# Patient Record
Sex: Male | Born: 1970 | Race: White | Hispanic: No | Marital: Married | Smoking: Never smoker
Health system: Southern US, Community
[De-identification: ages and names within clinical notes are randomized; demographics above are authoritative.]

## PROBLEM LIST (undated history)

## (undated) DIAGNOSIS — E119 Type 2 diabetes mellitus without complications: Secondary | ICD-10-CM

## (undated) HISTORY — PX: ANTERIOR CRUCIATE LIGAMENT REPAIR: SHX115

---

## 2007-08-16 ENCOUNTER — Encounter: Admission: RE | Admit: 2007-08-16 | Discharge: 2007-08-16 | Payer: Self-pay | Admitting: Family Medicine

## 2008-09-08 IMAGING — CT CT ABDOMEN W/O CM
2 of 3 series · 17 of 46 positions shown, 19 images · non-contrast
Comparison: None.

CT ABDOMEN

CLINICAL DATA: Hematuria.  Right flank pain 3 weeks.  Nausea.
History prior renal calculi.

CT ABDOMEN AND PELVIS WITHOUT CONTRAST
TECHNIQUE: Multidetector CT imaging of the abdomen and pelvis was
performed following the standard
protocol without intravenous contrast.

[Series 2: renal stone w/o · axial · non-contrast · 0.68mm/px · z∈[-326,-0]mm · 14 of 74 slices shown, 16 images]
[im 5/74  soft-tissue]
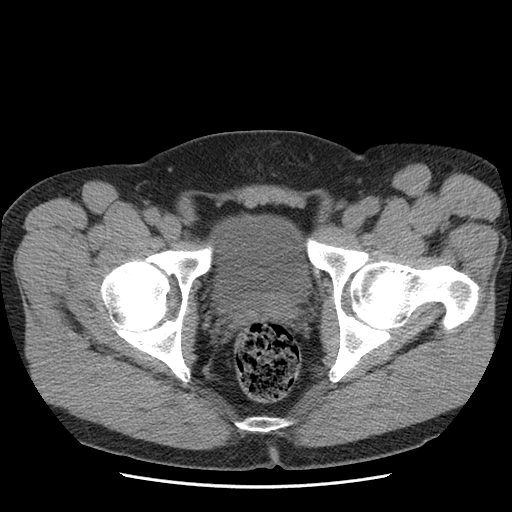
[im 5/74  bone]
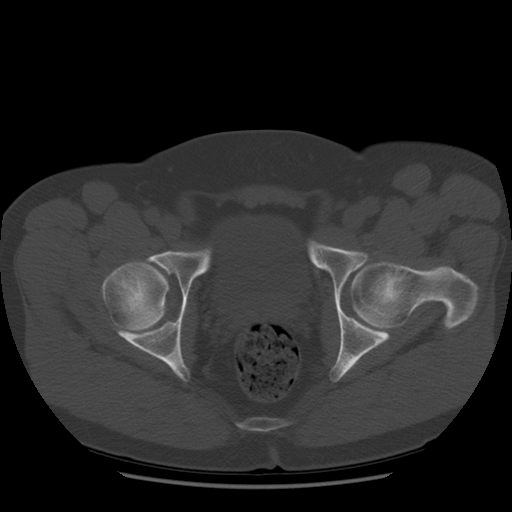
[im 10/74  soft-tissue]
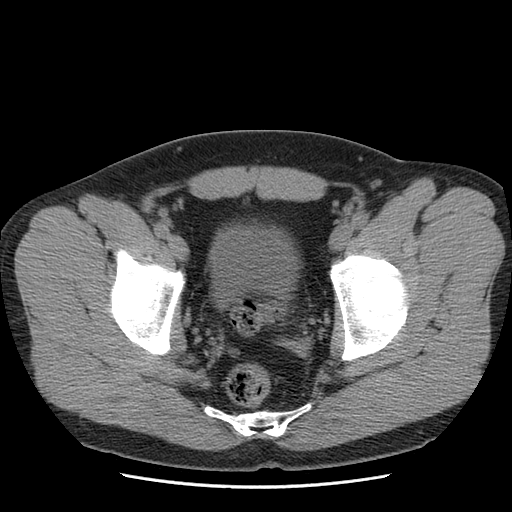
[im 15/74  soft-tissue]
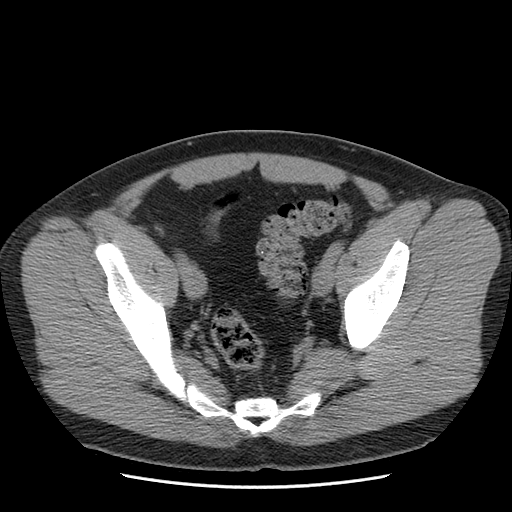
[im 19/74  soft-tissue]
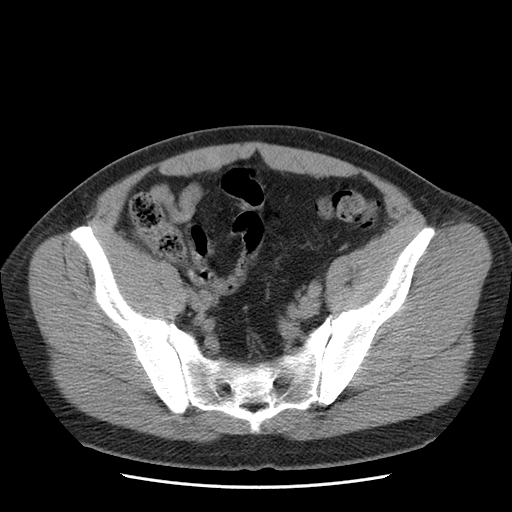
[im 24/74  soft-tissue]
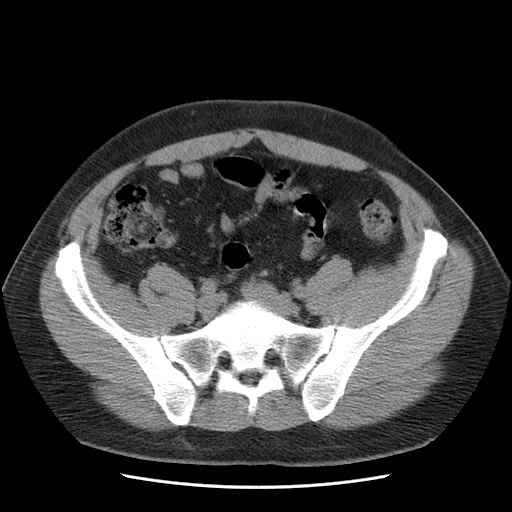
[im 29/74  soft-tissue]
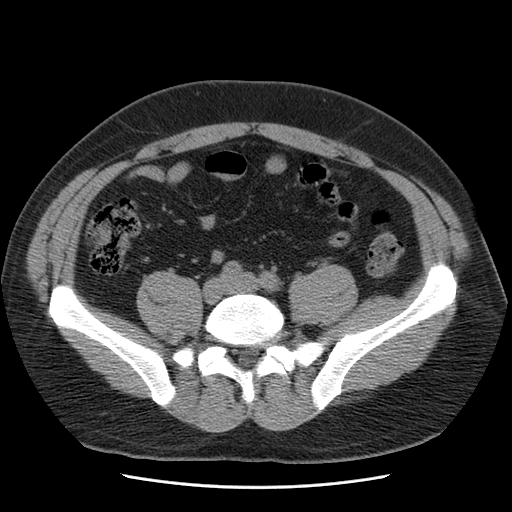
[im 33/74  soft-tissue]
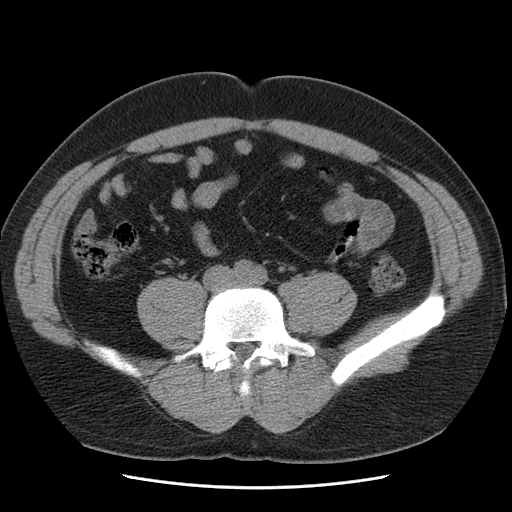
[im 41/74  soft-tissue]
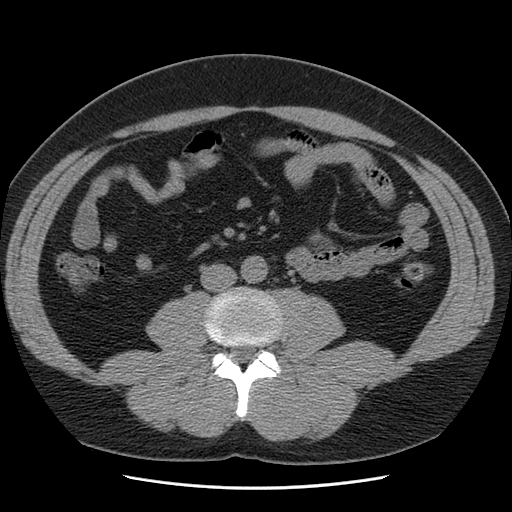
[im 45/74  soft-tissue]
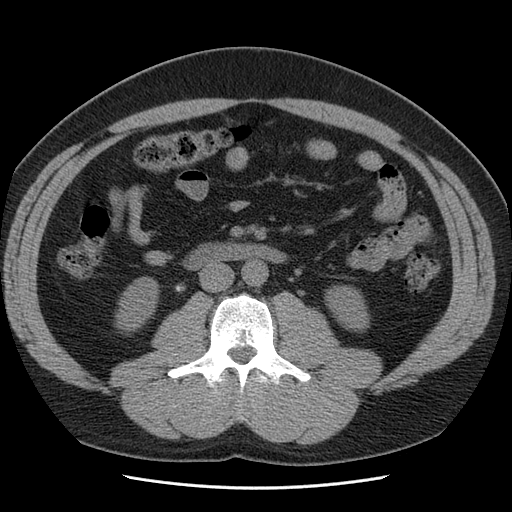
[im 45/74  bone]
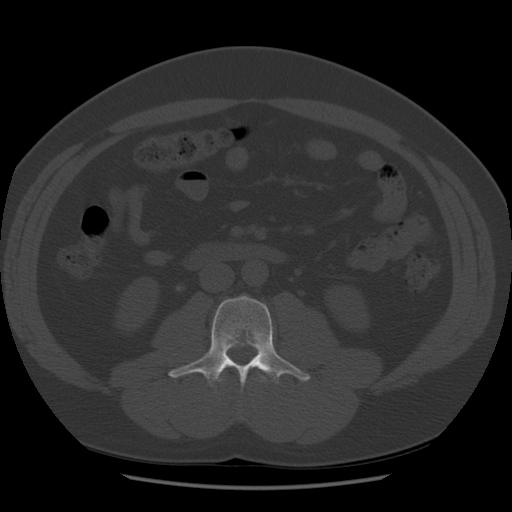
[im 50/74  soft-tissue]
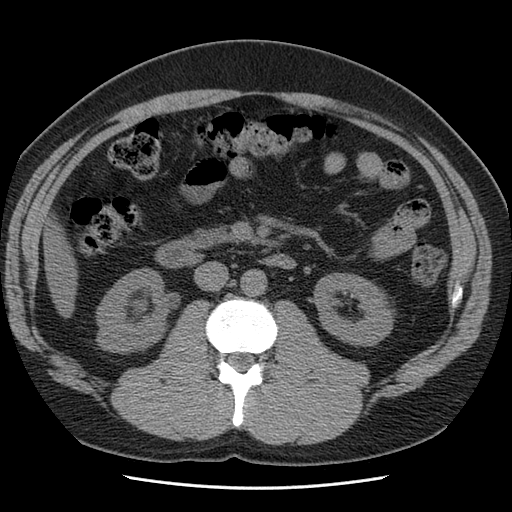
[im 55/74  soft-tissue]
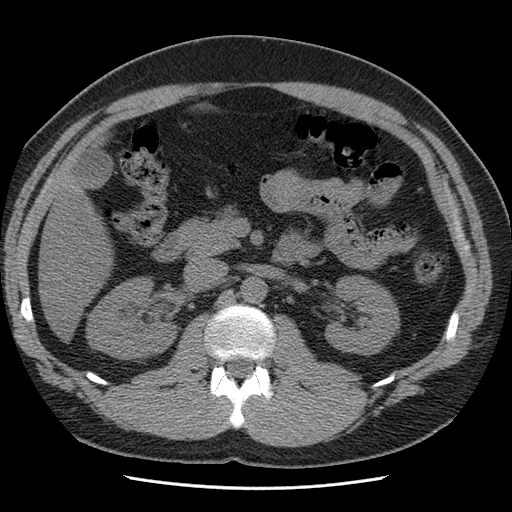
[im 59/74  soft-tissue]
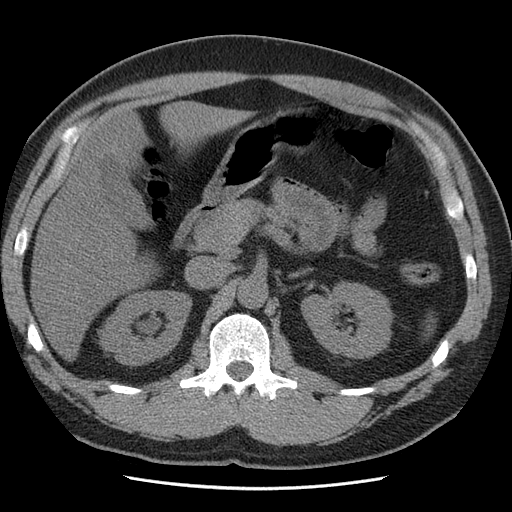
[im 64/74  soft-tissue]
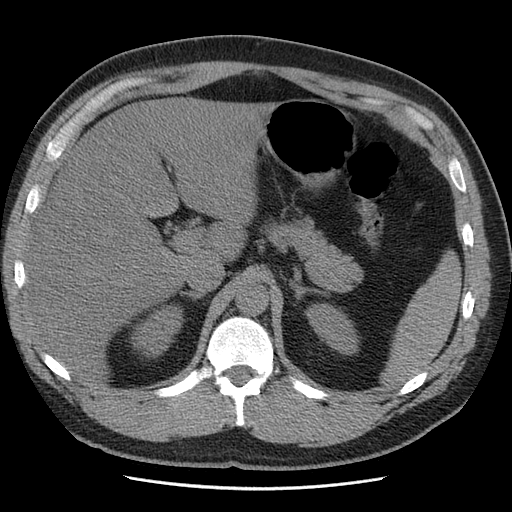
[im 69/74  soft-tissue]
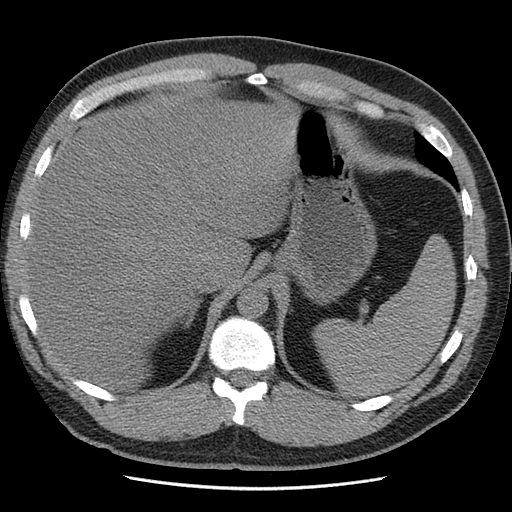

[Series 400: cor abd · coronal · 0.85mm/px · 3 of 136 slices shown]
[im 46/136  soft-tissue]
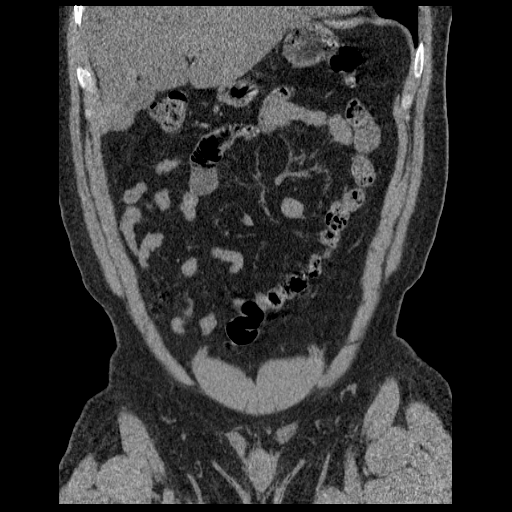
[im 61/136  soft-tissue]
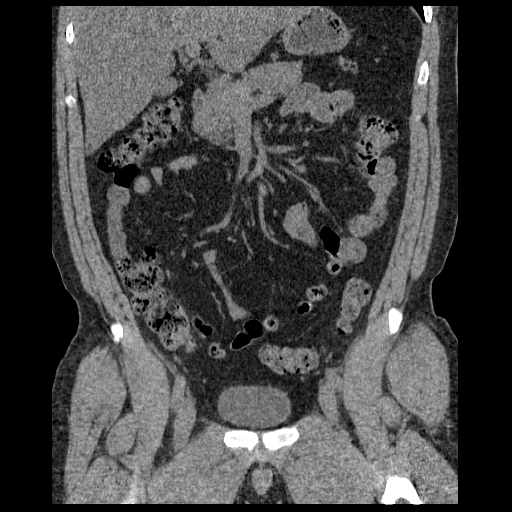
[im 76/136  soft-tissue]
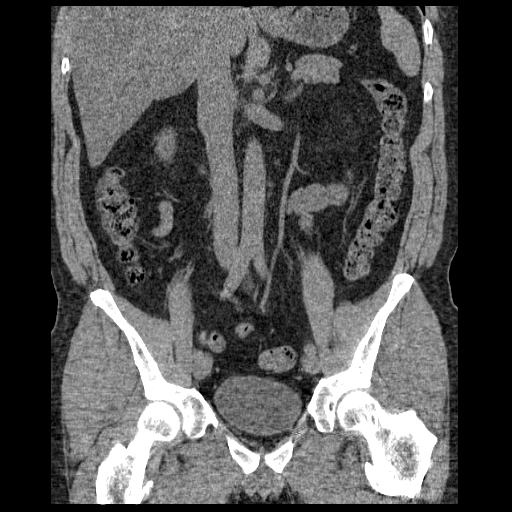

[17 of 46 positions shown; findings below may reference images not displayed]

FINDINGS: Obstructing 6 mm right ureteral pelvic junction calculus
is seen with moderate proximal hydronephrosis.  Three 1 mm
nonobstructing upper, mid, lower pole right renal calculi and
solitary 1 mm nonobstructing lower pole left (sagittal image 8873)
noted.  No left renal focal lesion or hydronephrosis seen.  Slight
diffuse fatty infiltration liver finding noted.  Moderate retained
colonic feces without inflammatory bowel or intestinal obstructive
findings visualized.  Remaining abdominal organs and lung bases
appear normal without inflammation, free fluid or adenopathy.
IMPRESSION: 1.  6 mm obstructing right ureteral pelvic junction calculus with
proximal moderate hydronephrosis.
2.  Slight diffuse fatty infiltration liver.
3.  Three tiny nonobstructing right renal calculi.
4.  Moderate retained colonic feces may represent constipation -
need clinical correlation.
5.  Otherwise, negative.

CT PELVIS
FINDINGS: Distal urinary tract appears normal.  Moderate retained
colonic feces without intestinal obstruction or inflammatory bowel
disease noted.  Discrete appendix is not identified yet no right
lower quadrant inflammation is seen.  No adenopathy or free fluid
noted.
IMPRESSION: 1.  Moderate retained colonic feces may represent constipation.
2.  Otherwise, negative.

## 2016-07-24 ENCOUNTER — Encounter (HOSPITAL_COMMUNITY): Payer: Self-pay

## 2016-07-24 ENCOUNTER — Emergency Department (HOSPITAL_COMMUNITY)
Admission: EM | Admit: 2016-07-24 | Discharge: 2016-07-24 | Disposition: A | Discharge status: Home / Self Care | Payer: Worker's Compensation | Attending: Emergency Medicine | Admitting: Emergency Medicine

## 2016-07-24 ENCOUNTER — Emergency Department (HOSPITAL_COMMUNITY): Payer: Worker's Compensation

## 2016-07-24 DIAGNOSIS — S99911A Unspecified injury of right ankle, initial encounter: Secondary | ICD-10-CM | POA: Diagnosis present

## 2016-07-24 DIAGNOSIS — S93401A Sprain of unspecified ligament of right ankle, initial encounter: Secondary | ICD-10-CM | POA: Insufficient documentation

## 2016-07-24 DIAGNOSIS — E119 Type 2 diabetes mellitus without complications: Secondary | ICD-10-CM | POA: Insufficient documentation

## 2016-07-24 DIAGNOSIS — W010XXA Fall on same level from slipping, tripping and stumbling without subsequent striking against object, initial encounter: Secondary | ICD-10-CM | POA: Diagnosis not present

## 2016-07-24 DIAGNOSIS — Y9301 Activity, walking, marching and hiking: Secondary | ICD-10-CM | POA: Diagnosis not present

## 2016-07-24 DIAGNOSIS — Y9289 Other specified places as the place of occurrence of the external cause: Secondary | ICD-10-CM | POA: Diagnosis not present

## 2016-07-24 DIAGNOSIS — Y990 Civilian activity done for income or pay: Secondary | ICD-10-CM | POA: Diagnosis not present

## 2016-07-24 HISTORY — DX: Type 2 diabetes mellitus without complications: E11.9

## 2016-07-24 MED ORDER — HYDROCODONE-ACETAMINOPHEN 5-325 MG PO TABS
1.0000 | ORAL_TABLET | Freq: Once | ORAL | Status: DC
  Filled 2016-07-24: qty 1

## 2016-07-24 MED ORDER — DICLOFENAC SODIUM 50 MG PO TBEC: 50.0000 mg | DELAYED_RELEASE_TABLET | Freq: Two times a day (BID) | ORAL | 0 refills | Status: AC

## 2016-07-24 NOTE — Progress Notes (Signed)
Orthopedic Tech Progress Note Patient Details:  Larry Garcia [birth year] [id number]  Ortho Devices Type of Ortho Device: ASO, Crutches Ortho Device/Splint Location: right ankle foot. Ortho Device/Splint Interventions: Application, Adjustment   Larry Garcia, Larry Garcia 07/24/2016, 10:39 PM

## 2016-07-24 NOTE — ED Triage Notes (Signed)
Pt states that he was working and tripped over a box injuring his R ankle. Swelling and bruising noted.

## 2016-07-24 NOTE — ED Notes (Signed)
Pt refused medication after RN had opened. Pt informed if he changed his mind he could get the pain medication or we could switch it to something less potent.

## 2016-07-24 NOTE — ED Notes (Signed)
Ortho tech paged  

## 2016-07-24 NOTE — ED Provider Notes (Signed)
[facility]-EMERGENCY DEPT Provider Note   CSN: [id number] Arrival date & time: 07/24/16  2023   By signing my name below, I, Soijett Blue, attest that this documentation has been prepared under the direction and in the presence of Kerrie BuffaloHope Neese, NP Electronically Signed: Soijett Blue, ED Scribe. 07/24/16. 10:04 PM.  History   Chief Complaint Chief Complaint  Patient presents with  . Ankle Injury    HPI Brayton Cavesllen M Duman is a 46 y.o. male with a PMHx of DM, who presents to the Emergency Department complaining of right ankle injury occurring PTA. Pt reports associated right ankle swelling, tingling to right ankle, and bruising to right ankle. Pt has tried 600 mg ibuprofen with mild relief of his symptoms.  Pt reports that he was ambulating out of work when he tripped over a box which caused him to roll his right ankle. Pt right ankle pain is worsened with ambulation and alleviated with rest. He denies wound and any other symptoms.    The history is provided by the patient. No language interpreter was used.  Ankle Injury  This is a new problem. The current episode started 3 to 5 hours ago. The problem occurs rarely. The problem has not changed since onset.The symptoms are aggravated by walking. Relieved by: ibuprofen. Treatments tried: ibuprofen. The treatment provided no relief.    Past Medical History:  Diagnosis Date  . Diabetes mellitus without complication ([facility])     There are no active problems to display for this patient.   Past Surgical History:  Procedure Laterality Date  . ANTERIOR CRUCIATE LIGAMENT REPAIR         Home Medications    Prior to Admission medications   Medication Sig Start Date End Date Taking? Authorizing Provider  diclofenac (VOLTAREN) 50 MG EC tablet Take 1 tablet (50 mg total) by mouth 2 (two) times daily. 07/24/16   Janne NapoleonNeese, Hope M, NP    Family History No family history on file.  Social History Social History  Substance Use Topics  . Smoking status:  Never Smoker  . Smokeless tobacco: Never Used  . Alcohol use No     Allergies   Patient has no known allergies.   Review of Systems Review of Systems  Gastrointestinal: Negative for nausea.  Musculoskeletal: Positive for arthralgias (right ankle) and joint swelling (right ankle).  Skin: Positive for color change (bruising to right ankle). Negative for wound.  Neurological: Negative for weakness.  Psychiatric/Behavioral: The patient is not nervous/anxious.      Physical Exam Updated Vital Signs BP (!) 155/93 (BP Location: Left Arm)   Pulse 100   Temp 97.7 F (36.5 C) (Oral)   Resp 18   SpO2 96%   Physical Exam  Constitutional: He appears well-developed and well-nourished. No distress.  HENT:  Head: Normocephalic and atraumatic.  Eyes: EOM are normal.  Neck: Neck supple.  Cardiovascular: Normal rate.   Pulmonary/Chest: Effort normal. No respiratory distress.  Abdominal: He exhibits no distension.  Musculoskeletal: He exhibits tenderness.       Right ankle: He exhibits swelling and ecchymosis. He exhibits no deformity, no laceration and normal pulse. Decreased range of motion: due to pain. Tenderness. Lateral malleolus tenderness found. Achilles tendon normal.  Neurological: He is alert.  Skin: Skin is warm and dry.  Psychiatric: He has a normal mood and affect. His behavior is normal.  Nursing note and vitals reviewed.    ED Treatments / Results  DIAGNOSTIC STUDIES: Oxygen Saturation is 96% on RA, nl  by my interpretation.    COORDINATION OF CARE: 10:01 PM Discussed treatment plan with pt at bedside and pt agreed to plan.   Labs (all labs ordered are listed, but only abnormal results are displayed) Labs Reviewed - No data to display  Radiology Dg Ankle Complete Right  Result Date: 07/24/2016 CLINICAL DATA:  Tripped over a box injuring RIGHT ankle while working today at 1800 hours, swelling and bruising laterally, initial encounter EXAM: RIGHT ANKLE -  COMPLETE 3+ VIEW COMPARISON:  None FINDINGS: Soft tissue swelling greatest laterally. Osseous mineralization normal. Ankle joint space preserved. No acute fracture, dislocation, or bone destruction. IMPRESSION: No acute osseous abnormalities. Electronically Signed   By: Ulyses Southward M.D.   On: 07/24/2016 21:43    Procedures Procedures (including critical care time)  Medications Ordered in ED Medications  HYDROcodone-acetaminophen (NORCO/VICODIN) 5-325 MG per tablet 1 tablet (not administered)     Initial Impression / Assessment and Plan / ED Course  I have reviewed the triage vital signs and the nursing notes. Patient X-Ray negative for obvious fracture or dislocation.  Pt advised to follow up with orthopedics. Patient given ASO splint and crutches while in ED. Pt will be discharged home with voltaren prescription. Conservative therapy recommended and discussed. Patient will be discharged home & is agreeable with above plan. Returns precautions discussed. Pt appears safe for discharge.  Final Clinical Impressions(s) / ED Diagnoses   Final diagnoses:  Sprain of right ankle, unspecified ligament, initial encounter    New Prescriptions New Prescriptions   DICLOFENAC (VOLTAREN) 50 MG EC TABLET    Take 1 tablet (50 mg total) by mouth 2 (two) times daily.  I personally performed the services described in this documentation, which was scribed in my presence. The recorded information has been reviewed and is accurate.    Kerrie Buffalo [place], [place] 07/24/16 2207    Geoffery Lyons, MD 07/24/16 2350

## 2016-07-24 NOTE — ED Notes (Signed)
Pt and family understood dc material. NAD noted. Scripts given at dc 

## 2017-08-17 IMAGING — DX DG ANKLE COMPLETE 3+V*R*
3 series · 3 of 3 positions shown · non-contrast
Comparison: None

CLINICAL DATA: Tripped over a box injuring RIGHT ankle while
working today at 3622 hours, swelling and bruising laterally,
initial encounter

EXAM:
RIGHT ANKLE - COMPLETE 3+ VIEW

[ankle ap]
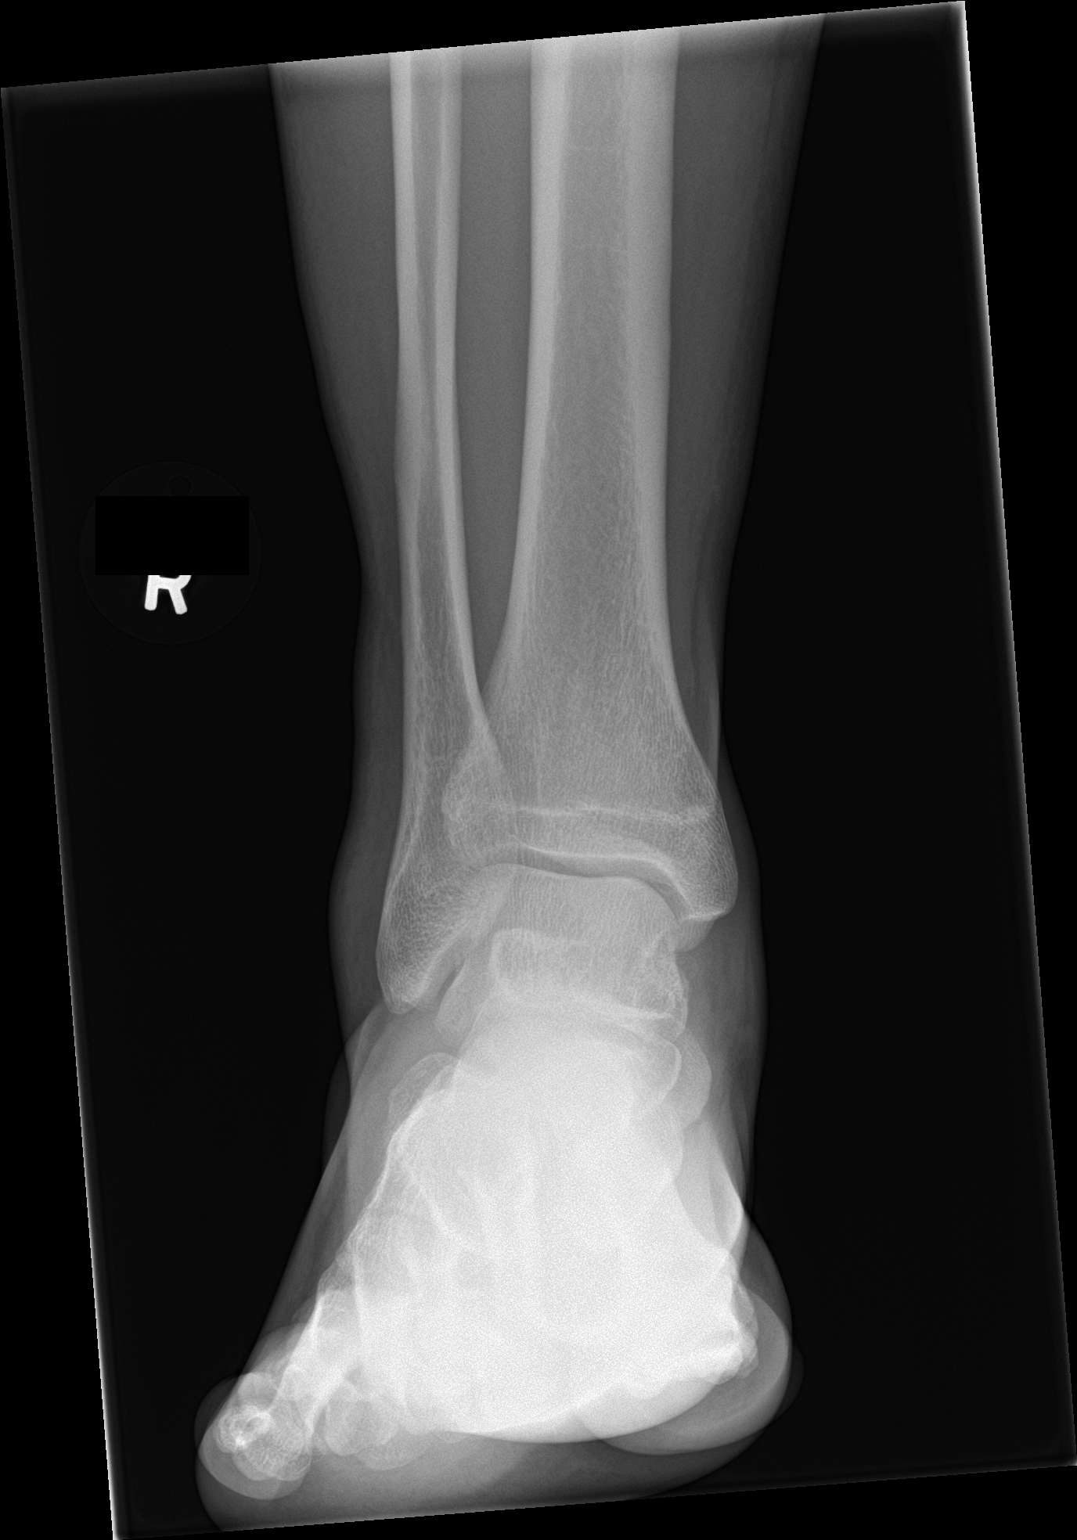

[ankle obl]
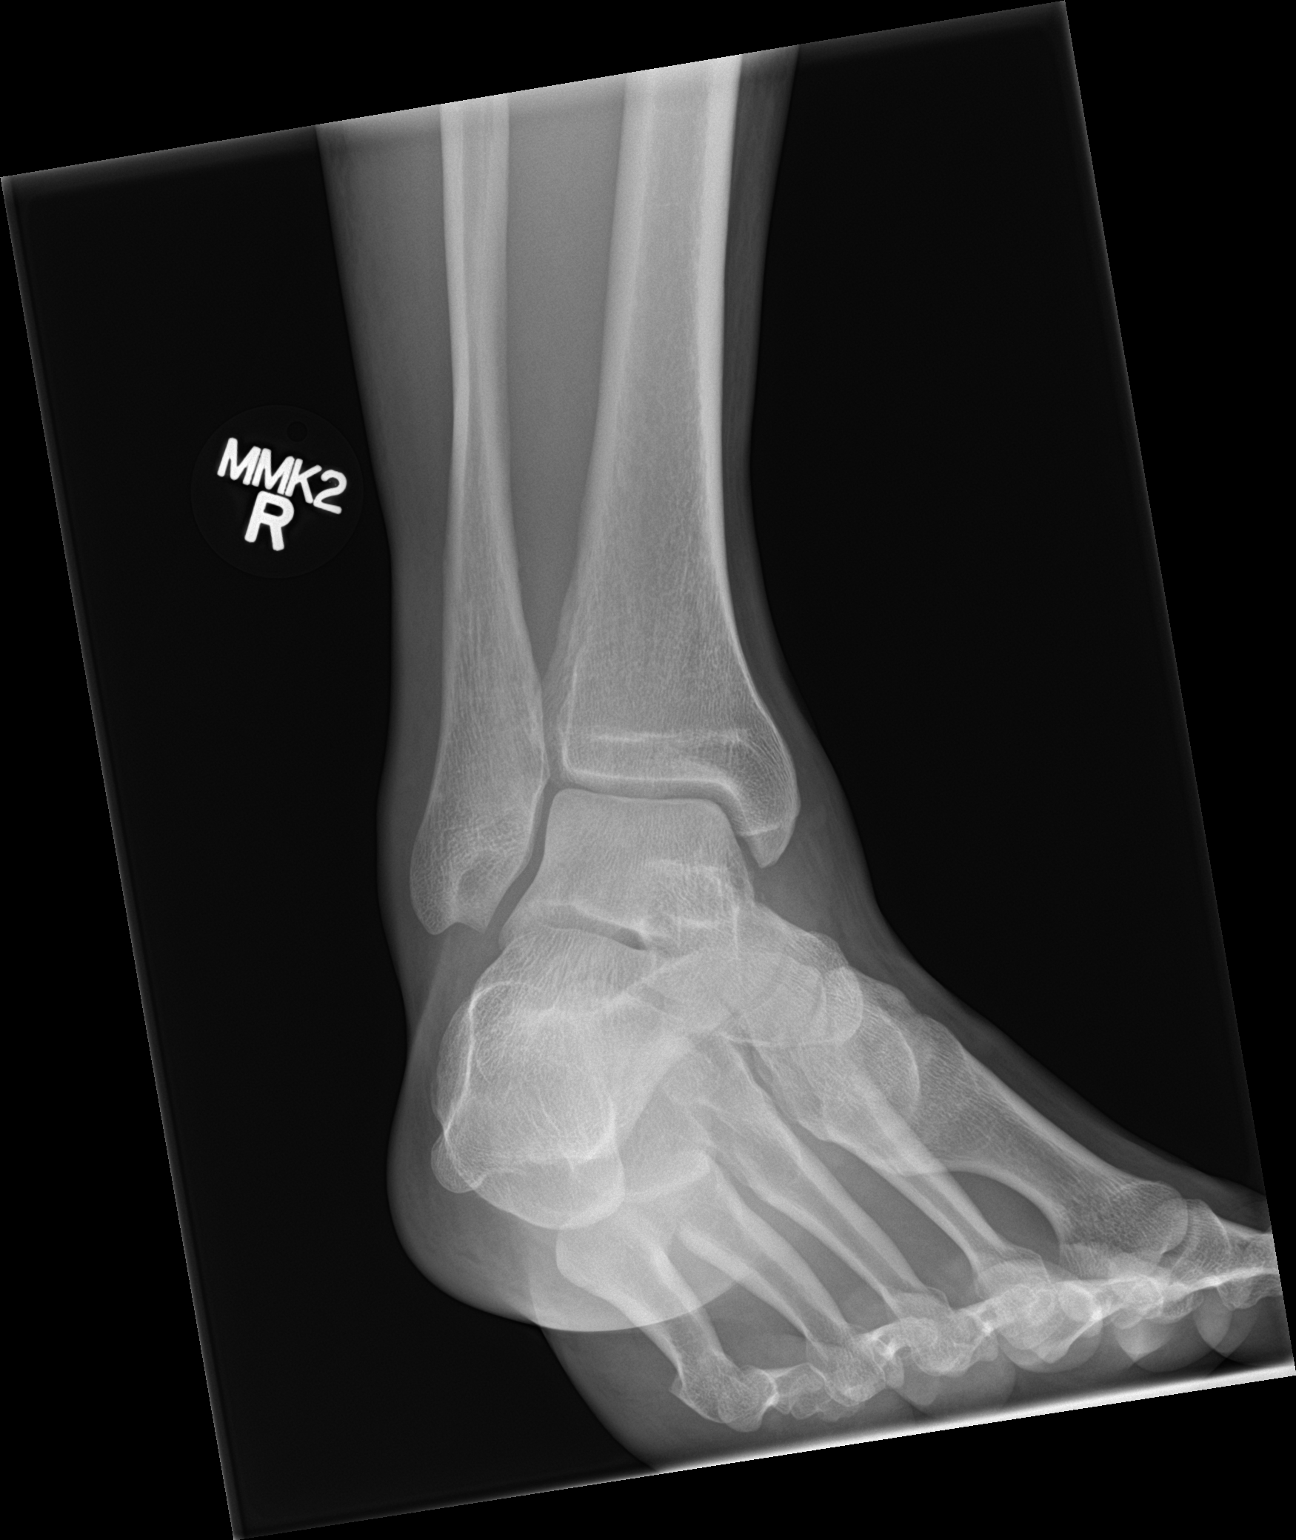

[ankle lat]
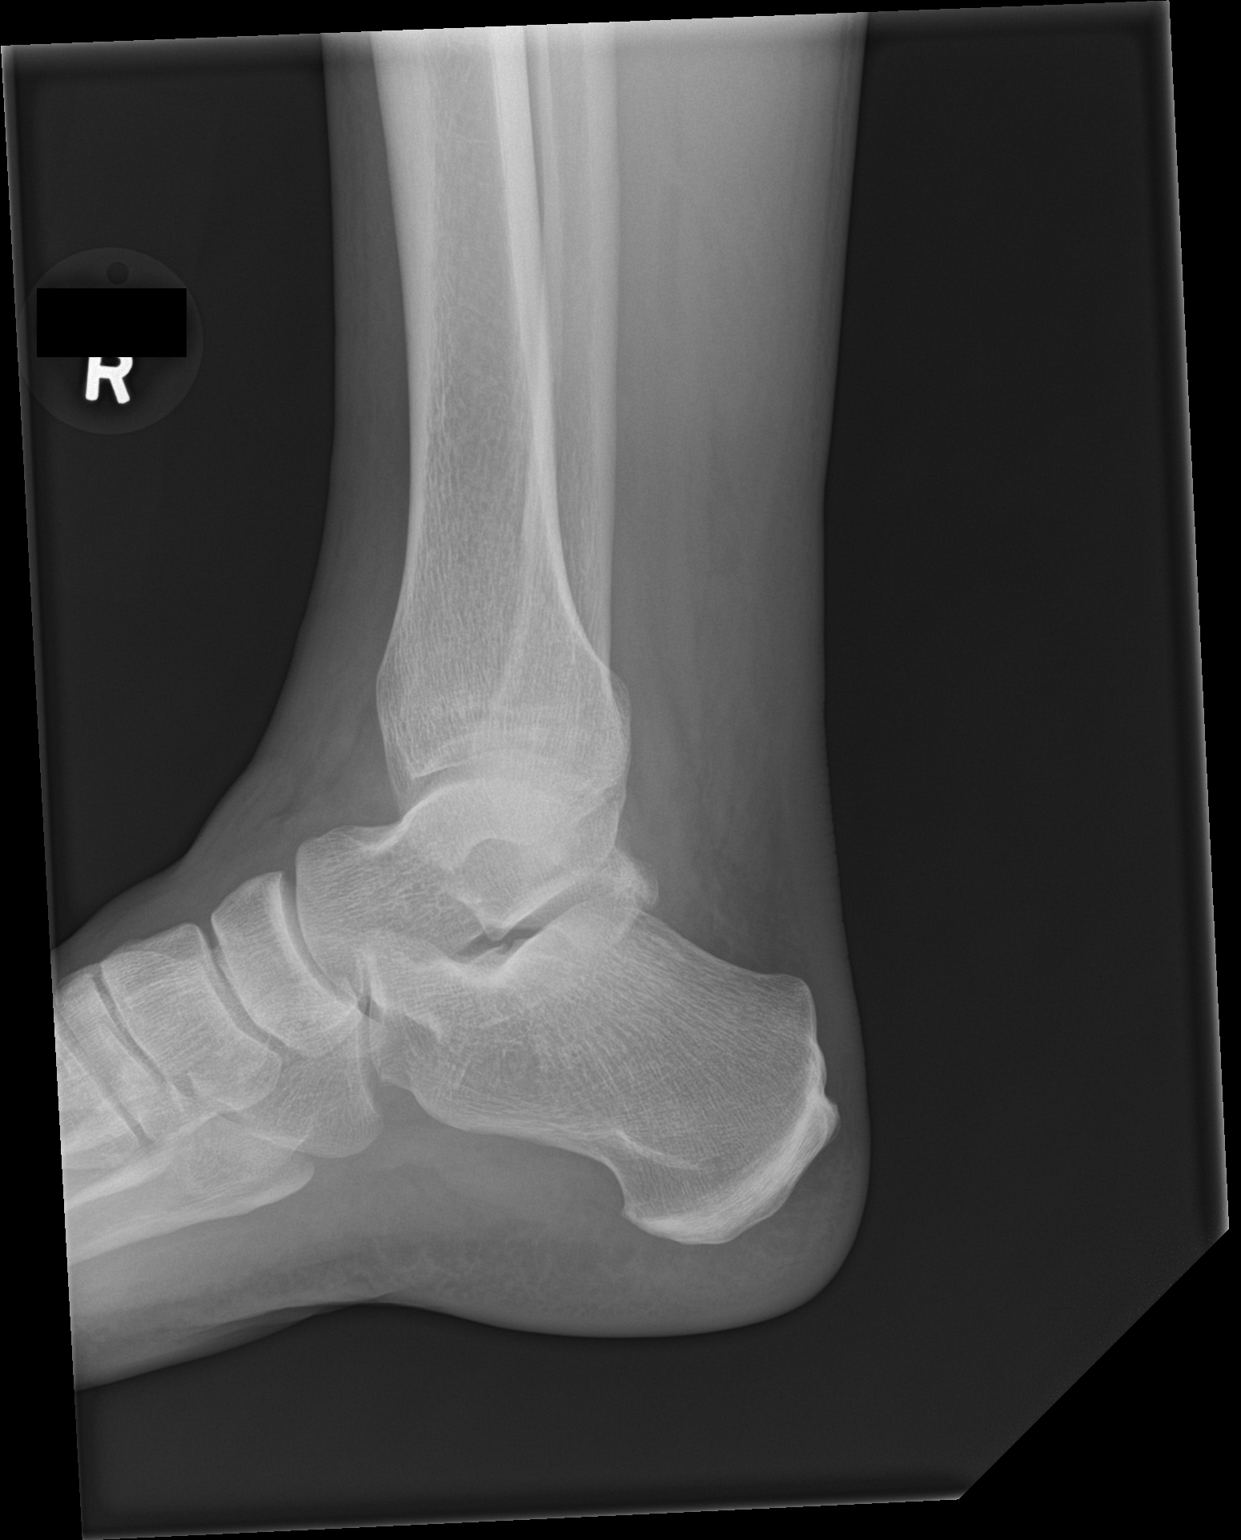

[3 of 3 positions shown; findings below may reference images not displayed]

FINDINGS: Soft tissue swelling greatest laterally.

Osseous mineralization normal.

Ankle joint space preserved.

No acute fracture, dislocation, or bone destruction.
IMPRESSION: No acute osseous abnormalities.

## 2018-10-07 ENCOUNTER — Emergency Department (HOSPITAL_BASED_OUTPATIENT_CLINIC_OR_DEPARTMENT_OTHER)
Admission: EM | Admit: 2018-10-07 | Discharge: 2018-10-07 | Disposition: A | Discharge status: Home / Self Care | Payer: BC Managed Care – PPO | Attending: Emergency Medicine | Admitting: Emergency Medicine

## 2018-10-07 ENCOUNTER — Emergency Department (HOSPITAL_BASED_OUTPATIENT_CLINIC_OR_DEPARTMENT_OTHER): Payer: BC Managed Care – PPO

## 2018-10-07 ENCOUNTER — Encounter (HOSPITAL_BASED_OUTPATIENT_CLINIC_OR_DEPARTMENT_OTHER): Payer: Self-pay

## 2018-10-07 ENCOUNTER — Other Ambulatory Visit: Payer: Self-pay

## 2018-10-07 DIAGNOSIS — Y999 Unspecified external cause status: Secondary | ICD-10-CM | POA: Diagnosis not present

## 2018-10-07 DIAGNOSIS — E119 Type 2 diabetes mellitus without complications: Secondary | ICD-10-CM | POA: Insufficient documentation

## 2018-10-07 DIAGNOSIS — Y9389 Activity, other specified: Secondary | ICD-10-CM | POA: Insufficient documentation

## 2018-10-07 DIAGNOSIS — S4992XA Unspecified injury of left shoulder and upper arm, initial encounter: Secondary | ICD-10-CM | POA: Diagnosis present

## 2018-10-07 DIAGNOSIS — X500XXA Overexertion from strenuous movement or load, initial encounter: Secondary | ICD-10-CM | POA: Insufficient documentation

## 2018-10-07 DIAGNOSIS — S43402A Unspecified sprain of left shoulder joint, initial encounter: Secondary | ICD-10-CM | POA: Diagnosis not present

## 2018-10-07 DIAGNOSIS — Y929 Unspecified place or not applicable: Secondary | ICD-10-CM | POA: Diagnosis not present

## 2018-10-07 NOTE — ED Triage Notes (Signed)
Pt states he heard a pop to left shoulder when lifting last night-NAD-steady gait

## 2018-10-07 NOTE — ED Notes (Signed)
Patient transported to X-ray 

## 2018-10-07 NOTE — Discharge Instructions (Signed)
Continue to range it regularly to avoid getting stiff.  Head and ice are fine and use ibuprofen as needed for pain.

## 2018-10-08 NOTE — ED Provider Notes (Signed)
[facility] EMERGENCY DEPARTMENT Provider Note   CSN: [id number] Arrival date & time: 10/07/18  1737     History   Chief Complaint Chief Complaint  Patient presents with  . Shoulder Injury    HPI Larry Garcia is a 48 y.o. male.     The history is provided by the patient.  Shoulder Injury This is a new problem. The current episode started yesterday. The problem occurs constantly. The problem has been gradually worsening. Associated symptoms comments: Pain in the left shoulder after trying to push up a tailgate of his truck last night and felt a pop and now pain with raising the arm.  No finger tingling or hand weakness. Exacerbated by: lifting the arm. The symptoms are relieved by rest. He has tried rest for the symptoms. The treatment provided no relief.    Past Medical History:  Diagnosis Date  . Diabetes mellitus without complication ([place])     There are no active problems to display for this patient.   Past Surgical History:  Procedure Laterality Date  . ANTERIOR CRUCIATE LIGAMENT REPAIR          Home Medications    Prior to Admission medications   Medication Sig Start Date End Date Taking? Authorizing Provider  diclofenac (VOLTAREN) 50 MG EC tablet Take 1 tablet (50 mg total) by mouth 2 (two) times daily. 07/24/16   Ashley Murrain, NP    Family History No family history on file.  Social History Social History   Tobacco Use  . Smoking status: Never Smoker  . Smokeless tobacco: Never Used  Substance Use Topics  . Alcohol use: No  . Drug use: No     Allergies   Lipitor [atorvastatin calcium]   Review of Systems Review of Systems  All other systems reviewed and are negative.    Physical Exam Updated Vital Signs BP 132/78 (BP Location: Right Arm)   Pulse 70   Temp 98.5 F (36.9 C) (Oral)   Resp 16   Ht 5\' 11"  (1.803 m)   Wt 99.8 kg   SpO2 99%   BMI 30.68 kg/m   Physical Exam Vitals signs and nursing note reviewed.   Constitutional:      Appearance: Normal appearance. He is normal weight.  HENT:     Head: Normocephalic.  Eyes:     Pupils: Pupils are equal, round, and reactive to light.  Cardiovascular:     Rate and Rhythm: Normal rate.     Pulses: Normal pulses.  Pulmonary:     Effort: Pulmonary effort is normal.  Musculoskeletal:     Comments: Inability to actively extend the left shoulder.  Full abduction, abduction, internal and external rotation of the left shoulder without difficulty.  Tenderness in the posterior portion of the shoulder and no notable swelling  Skin:    General: Skin is warm and dry.     Capillary Refill: Capillary refill takes less than 2 seconds.  Neurological:     General: No focal deficit present.     Mental Status: He is alert and oriented to person, place, and time. Mental status is at baseline.  Psychiatric:        Mood and Affect: Mood normal.        Behavior: Behavior normal.        Thought Content: Thought content normal.      ED Treatments / Results  Labs (all labs ordered are listed, but only abnormal results are displayed) Labs Reviewed -  No data to display  EKG None  Radiology Dg Shoulder Left  Result Date: 10/07/2018 CLINICAL DATA:  Left shoulder injury EXAM: LEFT SHOULDER - 2+ VIEW COMPARISON:  None. FINDINGS: There is no evidence of fracture or dislocation. There is no evidence of arthropathy or other focal bone abnormality. Soft tissues are unremarkable. IMPRESSION: Negative. Electronically Signed   By: Charlett Nose M.D.   On: 10/07/2018 18:29    Procedures Procedures (including critical care time)  Medications Ordered in ED Medications - No data to display   Initial Impression / Assessment and Plan / ED Course  I have reviewed the triage vital signs and the nursing notes.  Pertinent labs & imaging results that were available during my care of the patient were reviewed by me and considered in my medical decision making (see chart for  details).        Patient presenting today after an injury to the left shoulder yesterday.  Patient did experience a pop and is having pain with shoulder extension.  Patient states she was usually able to fully range it yesterday but now pain inhibits that.  He can be passively ranged without difficulty and only minor pain.  Suspect rotator cuff injury.  X-rays without acute findings.  Patient will follow-up with Dr. Rennis Chris.  reCommended ongoing range of motion but avoid heavy lifting.  Final Clinical Impressions(s) / ED Diagnoses   Final diagnoses:  Sprain of left shoulder, unspecified shoulder sprain type, initial encounter    ED Discharge Orders    None       Gwyneth Sprout, MD 10/08/18 [phone]

## 2019-10-31 IMAGING — CR DG SHOULDER 2+V*L*
3 series · 3 of 3 positions shown · non-contrast
Comparison: None.

CLINICAL DATA: Left shoulder injury

EXAM:
LEFT SHOULDER - 2+ VIEW

[w shoulder grashey left]
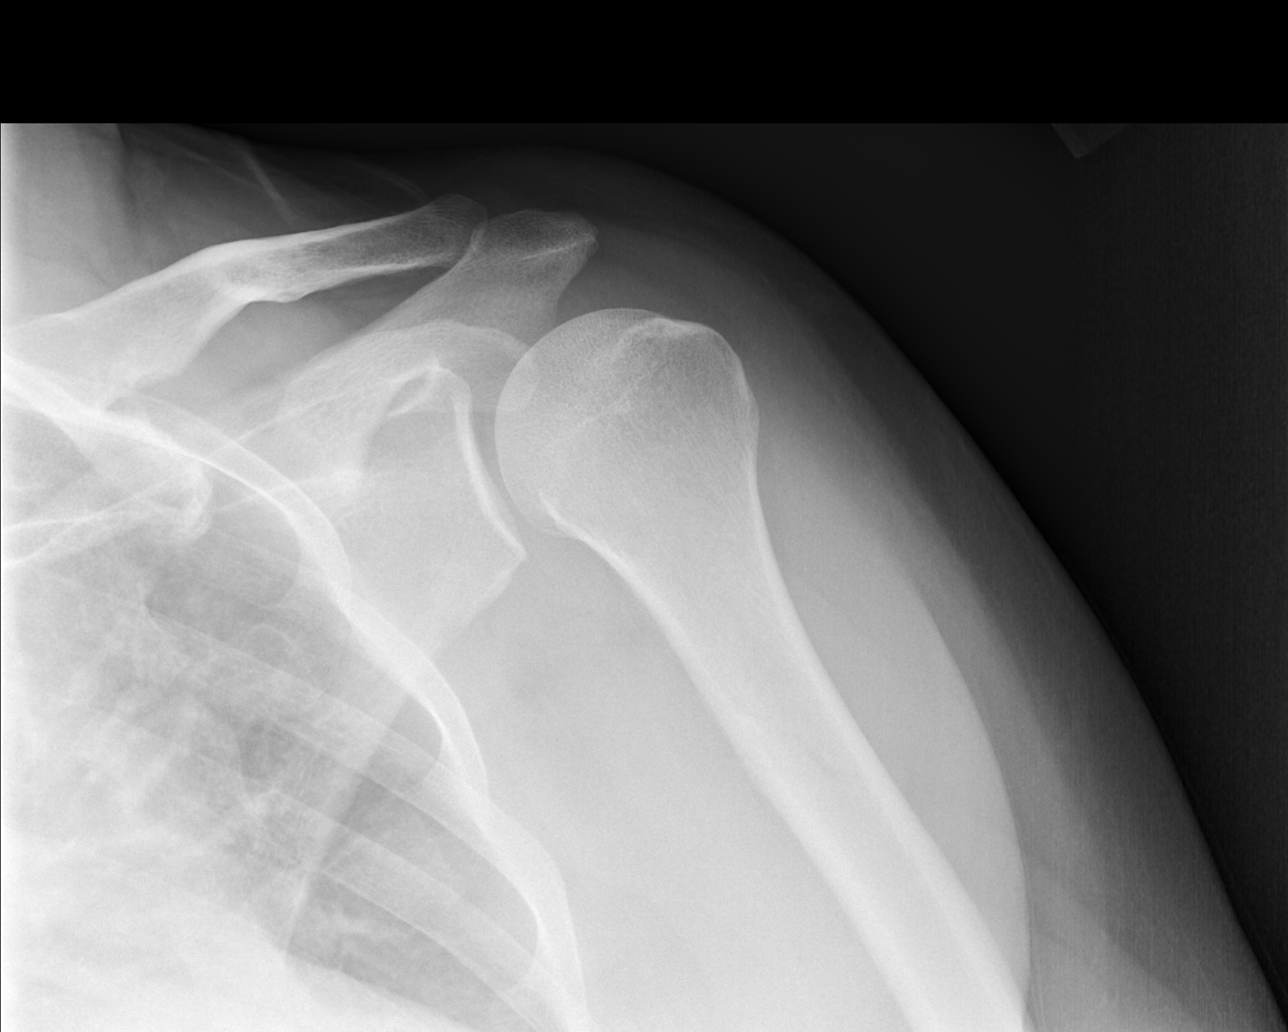

[w shoulder y view left]
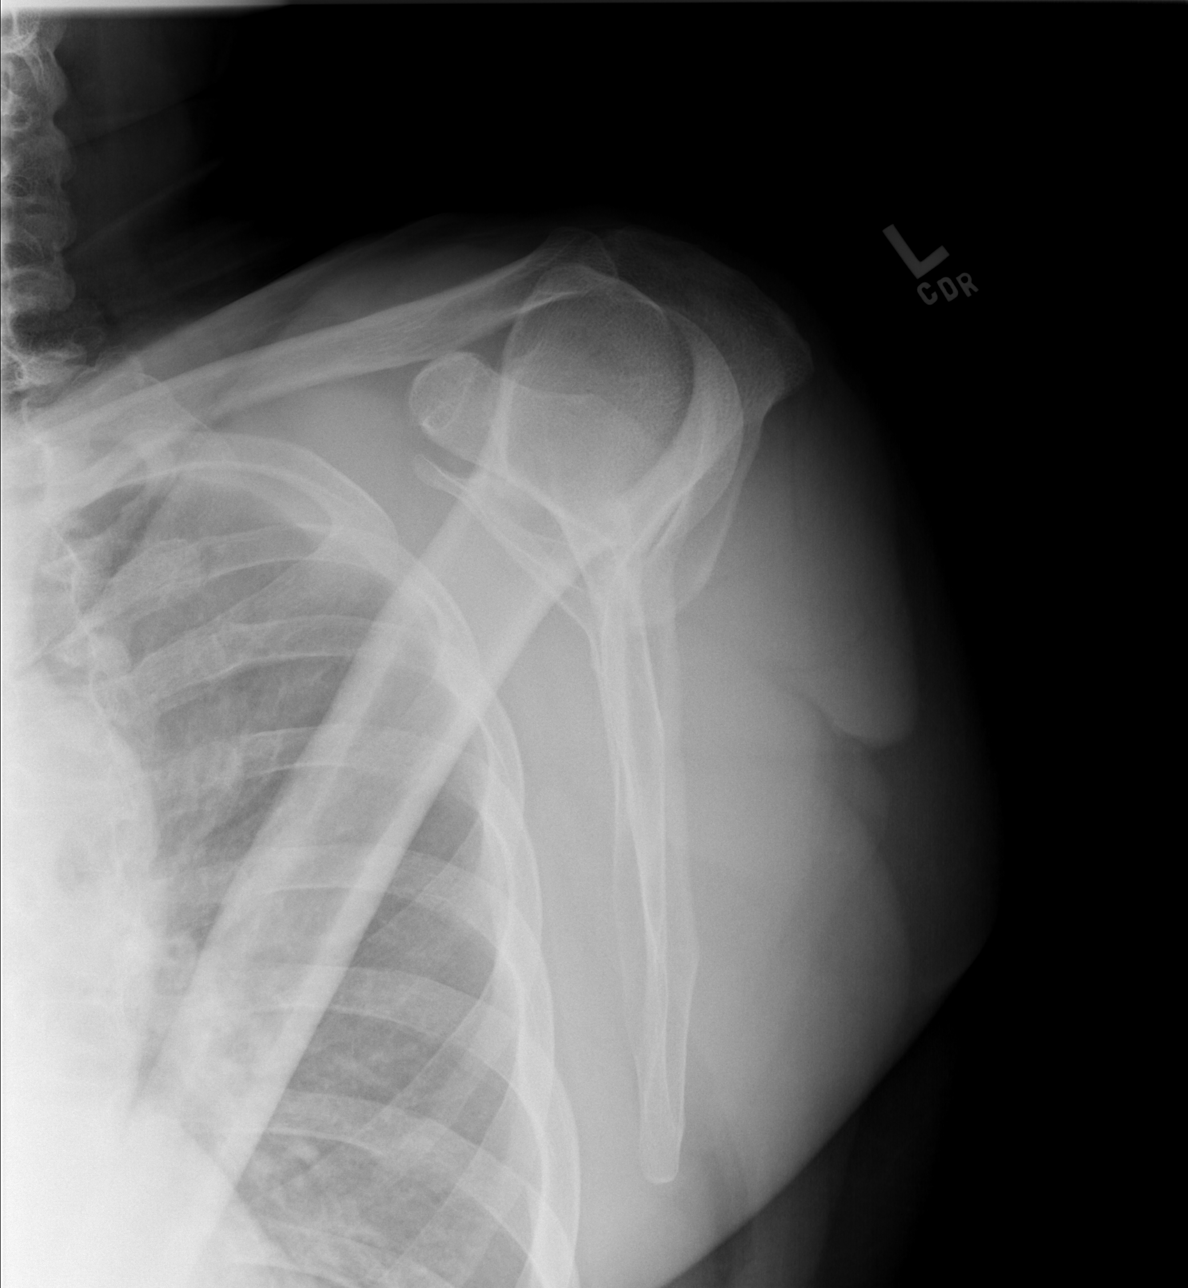

[x shoulder axillary left *]
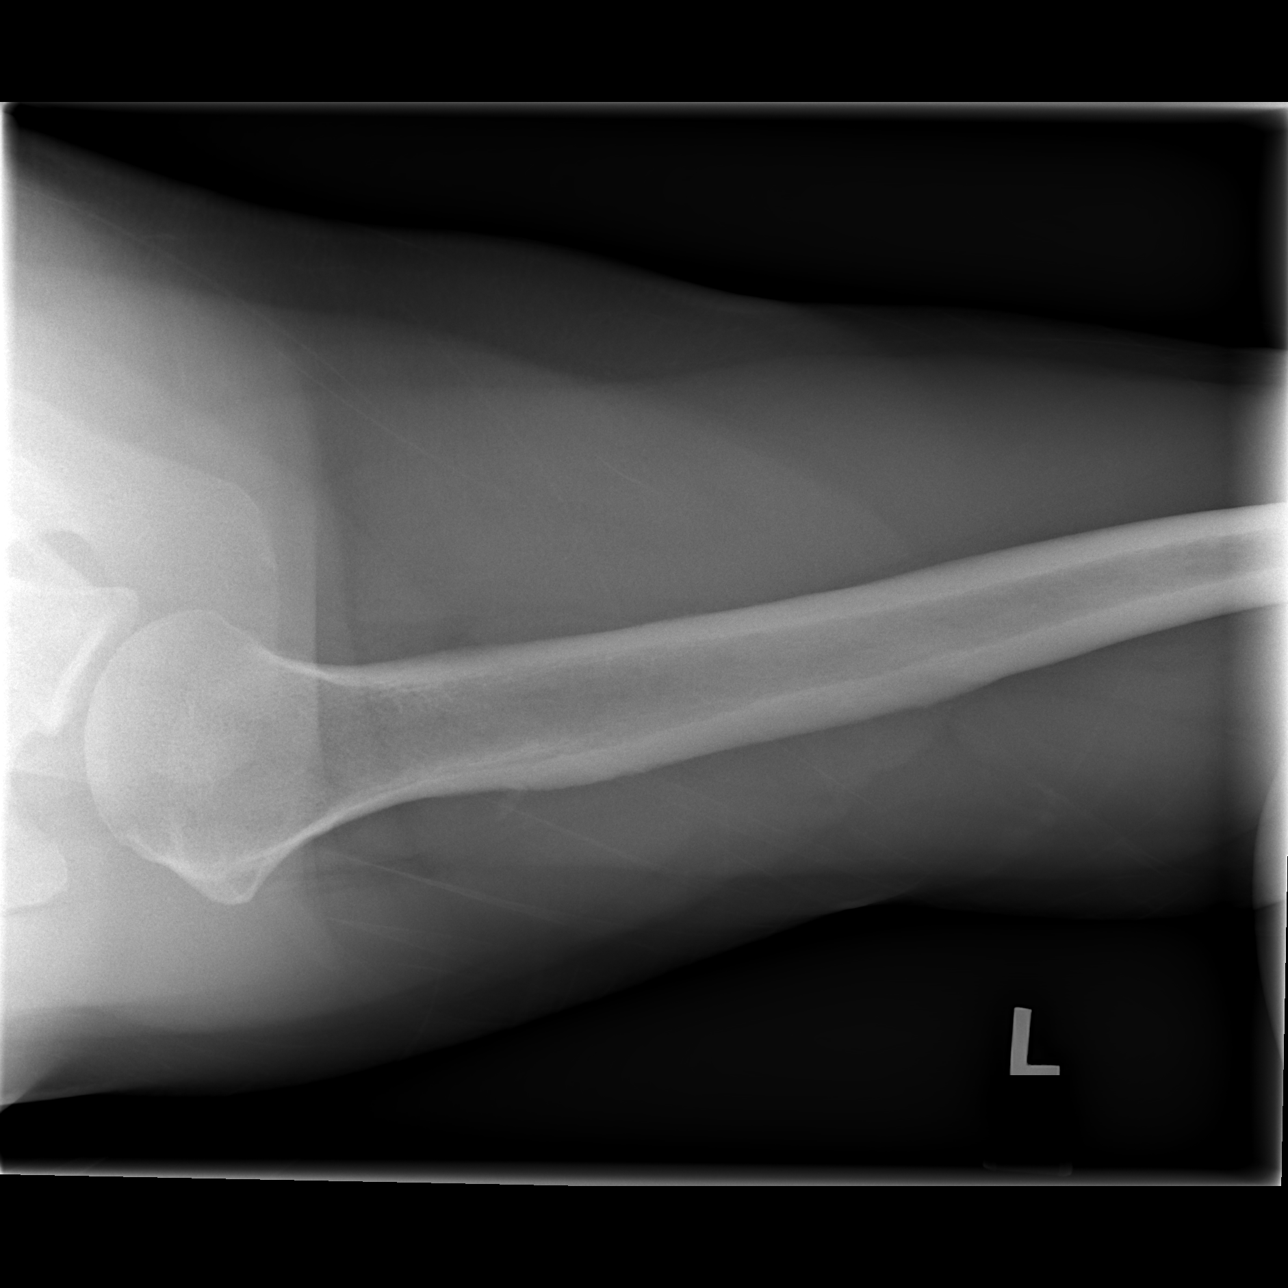

[3 of 3 positions shown; findings below may reference images not displayed]

FINDINGS: There is no evidence of fracture or dislocation. There is no
evidence of arthropathy or other focal bone abnormality. Soft
tissues are unremarkable.
IMPRESSION: Negative.
